# Patient Record
Sex: Female | Born: 2000 | Race: Black or African American | Hispanic: No | Marital: Single | Smoking: Never smoker
Health system: Southern US, Community
[De-identification: ages and names within clinical notes are randomized; demographics above are authoritative.]

## PROBLEM LIST (undated history)

## (undated) DIAGNOSIS — J45909 Unspecified asthma, uncomplicated: Secondary | ICD-10-CM

## (undated) DIAGNOSIS — T7840XA Allergy, unspecified, initial encounter: Secondary | ICD-10-CM

## (undated) HISTORY — DX: Allergy, unspecified, initial encounter: T78.40XA

## (undated) HISTORY — DX: Unspecified asthma, uncomplicated: J45.909

---
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
## 2018-04-03 HISTORY — PX: INTRAUTERINE DEVICE INSERTION: SHX323

## 2018-08-02 ENCOUNTER — Ambulatory Visit: Payer: Self-pay | Admitting: Podiatry

## 2018-08-08 ENCOUNTER — Other Ambulatory Visit: Payer: Self-pay

## 2018-08-08 ENCOUNTER — Ambulatory Visit: Payer: Self-pay | Admitting: Podiatry

## 2018-08-08 ENCOUNTER — Encounter: Payer: Self-pay | Admitting: Podiatry

## 2018-08-08 VITALS — Temp 98.1°F

## 2018-08-08 DIAGNOSIS — B351 Tinea unguium: Secondary | ICD-10-CM | POA: Diagnosis not present

## 2018-08-08 MED ORDER — TERBINAFINE HCL 250 MG PO TABS
250.0000 mg | ORAL_TABLET | Freq: Every day | ORAL | 0 refills | Status: DC
Start: 2018-08-08 — End: 2019-03-12

## 2018-08-08 NOTE — Progress Notes (Signed)
Subjective:   Patient ID: Doris Garcia, female   DOB: 18 y.o.   MRN: [id number]   HPI Presents stating she has had trouble with these nails for years and the fourth and fifth nails on both feet are becoming increasingly a problem for her.  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  She does not smoke and is very active   Review of Systems  All other systems reviewed and are negative.       Objective:  Physical Exam Vitals signs and nursing note reviewed.  Constitutional:      Appearance: She is well-developed.  Pulmonary:     Effort: Pulmonary effort is normal.  Musculoskeletal: Normal range of motion.  Skin:    General: Skin is warm.  Neurological:     Mental Status: She is alert.     Neurovascular status intact muscle strength is adequate range of motion within normal limits with patient noted to have dystrophic thickened nailbeds for 5 bilateral hallux right with discoloration and history of trauma and wearing soccer shoes that at times were too small for her feet.  Good digital perfusion noted     Assessment:  Probability that the problem she is experiencing is due to trauma with possibility also for fungal infection with family history     Plan:  H&P conditions reviewed.  We will get a try oral medication and I did explain there is no guarantee this will solve the problem and that ultimately nail removal may be necessary.  She wants to undergo this treatment and I did send her for liver function study and I placed her on Lamisil 250 mg daily for 90 days going over risk factors associated with it

## 2018-08-09 LAB — HEPATIC FUNCTION PANEL
AG Ratio: 1.7 (calc) (ref 1.0–2.5)
ALT: 7 U/L (ref 5–32)
AST: 14 U/L (ref 12–32)
Albumin: 4.7 g/dL (ref 3.6–5.1)
Alkaline phosphatase (APISO): 58 U/L (ref 36–128)
Bilirubin, Direct: 0.1 mg/dL (ref 0.0–0.2)
Globulin: 2.8 g/dL (calc) (ref 2.0–3.8)
Indirect Bilirubin: 0.3 mg/dL (calc) (ref 0.2–1.1)
Total Bilirubin: 0.4 mg/dL (ref 0.2–1.1)
Total Protein: 7.5 g/dL (ref 6.3–8.2)

## 2019-01-11 ENCOUNTER — Encounter (HOSPITAL_COMMUNITY): Payer: Self-pay | Admitting: *Deleted

## 2019-01-11 ENCOUNTER — Emergency Department (HOSPITAL_COMMUNITY)
Admission: EM | Admit: 2019-01-11 | Discharge: 2019-01-12 | Disposition: A | Discharge status: Home / Self Care | Payer: BC Managed Care – PPO | Attending: Emergency Medicine | Admitting: Emergency Medicine

## 2019-01-11 ENCOUNTER — Other Ambulatory Visit: Payer: Self-pay

## 2019-01-11 DIAGNOSIS — N83201 Unspecified ovarian cyst, right side: Secondary | ICD-10-CM | POA: Diagnosis not present

## 2019-01-11 DIAGNOSIS — N760 Acute vaginitis: Secondary | ICD-10-CM | POA: Diagnosis not present

## 2019-01-11 DIAGNOSIS — Z79899 Other long term (current) drug therapy: Secondary | ICD-10-CM | POA: Diagnosis not present

## 2019-01-11 DIAGNOSIS — Z113 Encounter for screening for infections with a predominantly sexual mode of transmission: Secondary | ICD-10-CM | POA: Diagnosis not present

## 2019-01-11 DIAGNOSIS — R1031 Right lower quadrant pain: Secondary | ICD-10-CM

## 2019-01-11 DIAGNOSIS — N83209 Unspecified ovarian cyst, unspecified side: Secondary | ICD-10-CM | POA: Diagnosis present

## 2019-01-11 DIAGNOSIS — R52 Pain, unspecified: Secondary | ICD-10-CM

## 2019-01-11 DIAGNOSIS — J45909 Unspecified asthma, uncomplicated: Secondary | ICD-10-CM | POA: Insufficient documentation

## 2019-01-11 DIAGNOSIS — B9689 Other specified bacterial agents as the cause of diseases classified elsewhere: Secondary | ICD-10-CM

## 2019-01-11 LAB — URINALYSIS, ROUTINE W REFLEX MICROSCOPIC
Bacteria, UA: NONE SEEN
Bilirubin Urine: NEGATIVE
Glucose, UA: NEGATIVE mg/dL
Ketones, ur: NEGATIVE mg/dL
Leukocytes,Ua: NEGATIVE
Nitrite: NEGATIVE
Protein, ur: NEGATIVE mg/dL
Specific Gravity, Urine: 1.006 (ref 1.005–1.030)
pH: 8 (ref 5.0–8.0)

## 2019-01-11 LAB — POC URINE PREG, ED: Preg Test, Ur: NEGATIVE

## 2019-01-11 MED ORDER — OXYCODONE-ACETAMINOPHEN 5-325 MG PO TABS
1.0000 | ORAL_TABLET | Freq: Once | ORAL | Status: AC
  Administered 2019-01-11: 1 via ORAL
  Filled 2019-01-11: qty 1

## 2019-01-11 NOTE — ED Provider Notes (Signed)
[facility] DEPT Provider Note   CSN: [id number] Arrival date & time: 01/11/19  2042     History   Chief Complaint Chief Complaint  Patient presents with  . Ovarian Cyst    HPI Doris Garcia is a 18 y.o. female with no significant medical history presents for RLQ and pelvic pain.   Patient states she has had intermittent gradually worsening right lower quadrant and pelvic pain that began 2 weeks ago was seen at [facility] office where she was diagnosed with ovarian cyst by ultrasound.  Patient states she was discharged with 600 mg ibuprofen but states that pain has since worsened to become constant, cramping, sharp and radiating into her back on the right side.  Patient states 2 hours prior to presentation to ED she had discharge which is unusual for her as she has an IUD in place. Patient denies any abdominal surgeries; states LBM was 2-3 days ago but states she has been eating less and usually has BM every other day at baseline.   Patient endorses some nausea intermittently none currently.  States she vomited once during soccer practice last week.  States pain is worse with movement but states she is unable to get comfortable now the pain is constant.  Patient denies fevers, chills, dysuria, frequency urgency, lightheadedness, dizziness or headache. Patient denies change in bowel movements.      HPI  Past Medical History:  Diagnosis Date  . Allergy   . Asthma     Patient Active Problem List   Diagnosis Date Noted  . Pronation of both feet 04/12/2016    History reviewed. No pertinent surgical history.   OB History   No obstetric history on file.      Home Medications    Prior to Admission medications   Medication Sig Start Date End Date Taking? Authorizing Provider  Budesonide (PULMICORT IN) Inhale into the lungs.    [provider]  ibuprofen (ADVIL,MOTRIN) 200 MG tablet Take 400 mg by mouth every 6 (six) hours as needed for  moderate pain.    [provider]  metoCLOPramide (REGLAN) 10 MG tablet Take 1 tablet (10 mg total) by mouth 4 (four) times daily. Patient not taking: Reported on 08/08/2018 12/30/15   Delos Haring, PA-C  mometasone (NASONEX) 50 MCG/ACT nasal spray Place 2 sprays into the nose daily.    [provider]  ondansetron (ZOFRAN ODT) 4 MG disintegrating tablet Take 1 tablet (4 mg total) by mouth every 8 (eight) hours as needed for nausea or vomiting. Patient not taking: Reported on 08/08/2018 12/30/15   Delos Haring, PA-C  Tavaborole [facility]) 5 % SOLN Apply one drop to each affected toenail once daily for 12 months Patient not taking: Reported on 12/29/2015 09/19/13   Harriet Masson, DPM  terbinafine (LAMISIL) 250 MG tablet Take 1 tablet (250 mg total) by mouth daily. 08/08/18   Wallene Huh, DPM    Family History No family history on file.  Social History Social History   Tobacco Use  . Smoking status: Never Smoker  . Smokeless tobacco: Never Used  Substance Use Topics  . Alcohol use: No  . Drug use: Not on file     Allergies   Penicillins   Review of Systems Review of Systems   Physical Exam Updated Vital Signs BP 131/77 (BP Location: Left Arm)   Pulse 60   Temp 99.3 F (37.4 C) (Oral)   Resp 16   SpO2 100%   Physical  Exam Vitals signs and nursing note reviewed. Exam conducted with a chaperone present.  Constitutional:      Appearance: She is not ill-appearing.  HENT:     Head: Normocephalic and atraumatic.     Mouth/Throat:     Mouth: Mucous membranes are moist.  Eyes:     General: No scleral icterus. Neck:     Musculoskeletal: No neck rigidity.  Cardiovascular:     Rate and Rhythm: Normal rate and regular rhythm.     Pulses: Normal pulses.     Heart sounds: Normal heart sounds.  Pulmonary:     Effort: Pulmonary effort is normal.     Breath sounds: Normal breath sounds.  Abdominal:     General: Abdomen is flat. Bowel sounds are normal.  There is no distension.     Palpations: Abdomen is soft.     Tenderness: There is abdominal tenderness (RLQ and pelvic). There is guarding. There is no right CVA tenderness, left CVA tenderness or rebound.  Genitourinary:    Comments: Vulva without lesions or abnormality Vaginal canal with blood pooling in vaginal canal Cervix appears normal, is closed, IUD strings  Right sided adnexal tenderness no CMT Musculoskeletal:     Right lower leg: No edema.     Left lower leg: No edema.  Skin:    General: Skin is warm and dry.     Capillary Refill: Capillary refill takes less than 2 seconds.  Neurological:     Mental Status: She is alert. Mental status is at baseline.  Psychiatric:        Behavior: Behavior normal.      ED Treatments / Results  Labs (all labs ordered are listed, but only abnormal results are displayed) Labs Reviewed  URINALYSIS, ROUTINE W REFLEX MICROSCOPIC - Abnormal; Notable for the following components:      Result Value   Color, Urine STRAW (*)    Hgb urine dipstick MODERATE (*)    All other components within normal limits  WET PREP, GENITAL  POC URINE PREG, ED  GC/CHLAMYDIA PROBE AMP ([place]) NOT [facility]    EKG None  Radiology No results found.  Procedures Procedures (including critical care time)  Medications Ordered in ED Medications  oxyCODONE-acetaminophen (PERCOCET/ROXICET) 5-325 MG per tablet 1 tablet (1 tablet Oral Given 01/11/19 2326)     Initial Impression / Assessment and Plan / ED Course  I have reviewed the triage vital signs and the nursing notes.  Pertinent labs & imaging results that were available during my care of the patient were reviewed by me and considered in my medical decision making (see chart for details).        Patient is 18 year old female with [facility] with worsening pain over the past 2 days.   Concern for torsion as patient is in severe pain which has worsened and  become constant now.  Also concern for cyst rupture. Pelvic exam with right-sided adnexal tenderness without CMT.   UA shows moderate Hgb likely due to bloody vaginal discharge.   Patient given percocet for pain control. Declined zofran for nausea.   Doubt pyelonephritis or urinary stone as patient has had no urinary symptoms, dysuria, has no CVA tenderness, and states pain is worse in RLQ.     12:19 AM Handoff to [place].  Wet prep and TV ultrasound pending for torsion rule out. If US without torsion -- repeat abdominal exam. Patient will likely be able to follow up  with OBGYN outpatient. If abdominal exam is worsened patient may require further workup at provider discretion.    Final Clinical Impressions(s) / ED Diagnoses   Final diagnoses:  RLQ abdominal pain    ED Discharge Orders    None       Gailen Shelter, [place] 01/12/19 0119    Maia Plan, MD 01/12/19 1154

## 2019-01-11 NOTE — ED Triage Notes (Signed)
Pt reporting right lower abdominal pain. Stating she was seen at her OBGYN on Wednesday, u/s completed and she was told she has an ovarian cyst. Taking ibuprofen, but the pain is worse. IUD in place.

## 2019-01-12 ENCOUNTER — Emergency Department (HOSPITAL_COMMUNITY): Payer: BC Managed Care – PPO

## 2019-01-12 LAB — WET PREP, GENITAL
Sperm: NONE SEEN
Trich, Wet Prep: NONE SEEN
Yeast Wet Prep HPF POC: NONE SEEN

## 2019-01-12 MED ORDER — METRONIDAZOLE 500 MG PO TABS: 500.0000 mg | ORAL_TABLET | Freq: Two times a day (BID) | ORAL | 0 refills | Status: DC

## 2019-01-12 NOTE — ED Provider Notes (Signed)
Patient signed out to me at shift change.  Patient pending ultrasound pelvis.  Abdomen reassessed, discomfort seems to be mainly located in the pelvis, and not in the abdomen.  Doubt appendicitis.  Ultrasound reveals 4 mm right-sided ovarian cyst.  No evidence of abscess or torsion.  Clue cells found on wet prep.  Will treat for BV.  Advised ibuprofen for ovarian cyst and follow-up with OB/GYN.  Return precautions discussed.   Montine Circle, PA-C 01/12/19 0159    Fatima Blank, MD 01/15/19 2112

## 2019-01-14 LAB — GC/CHLAMYDIA PROBE AMP (~~LOC~~) NOT AT ARMC
Chlamydia: NEGATIVE
Neisseria Gonorrhea: NEGATIVE

## 2019-03-12 ENCOUNTER — Encounter: Payer: Self-pay | Admitting: Gastroenterology

## 2019-03-12 ENCOUNTER — Other Ambulatory Visit (INDEPENDENT_AMBULATORY_CARE_PROVIDER_SITE_OTHER): Payer: BC Managed Care – PPO

## 2019-03-12 ENCOUNTER — Other Ambulatory Visit: Payer: Self-pay

## 2019-03-12 ENCOUNTER — Ambulatory Visit (INDEPENDENT_AMBULATORY_CARE_PROVIDER_SITE_OTHER): Payer: BC Managed Care – PPO | Admitting: Gastroenterology

## 2019-03-12 VITALS — BP 110/80 | HR 83 | Temp 98.3°F | Ht 66.0 in | Wt 146.0 lb

## 2019-03-12 DIAGNOSIS — K219 Gastro-esophageal reflux disease without esophagitis: Secondary | ICD-10-CM

## 2019-03-12 DIAGNOSIS — K625 Hemorrhage of anus and rectum: Secondary | ICD-10-CM

## 2019-03-12 DIAGNOSIS — R1030 Lower abdominal pain, unspecified: Secondary | ICD-10-CM

## 2019-03-12 DIAGNOSIS — K5900 Constipation, unspecified: Secondary | ICD-10-CM

## 2019-03-12 DIAGNOSIS — Z1159 Encounter for screening for other viral diseases: Secondary | ICD-10-CM

## 2019-03-12 LAB — CBC WITH DIFFERENTIAL/PLATELET
Basophils Absolute: 0.1 10*3/uL (ref 0.0–0.1)
Basophils Relative: 1 % (ref 0.0–3.0)
Eosinophils Absolute: 0.2 10*3/uL (ref 0.0–0.7)
Eosinophils Relative: 3.1 % (ref 0.0–5.0)
HCT: 44.8 % (ref 36.0–46.0)
Hemoglobin: 14.9 g/dL (ref 12.0–15.0)
Lymphocytes Relative: 31.9 % (ref 12.0–46.0)
Lymphs Abs: 1.7 10*3/uL (ref 0.7–4.0)
MCHC: 33.3 g/dL (ref 30.0–36.0)
MCV: 88.4 fl (ref 78.0–100.0)
Monocytes Absolute: 0.5 10*3/uL (ref 0.1–1.0)
Monocytes Relative: 8.8 % (ref 3.0–12.0)
Neutro Abs: 2.9 10*3/uL (ref 1.4–7.7)
Neutrophils Relative %: 55.2 % (ref 43.0–77.0)
Platelets: 230 10*3/uL (ref 150.0–400.0)
RBC: 5.07 Mil/uL (ref 3.87–5.11)
RDW: 13.5 % (ref 11.5–14.6)
WBC: 5.3 10*3/uL (ref 4.5–10.5)

## 2019-03-12 LAB — COMPREHENSIVE METABOLIC PANEL
ALT: 8 U/L (ref 0–35)
AST: 15 U/L (ref 0–37)
Albumin: 4.5 g/dL (ref 3.5–5.2)
Alkaline Phosphatase: 54 U/L (ref 39–117)
BUN: 15 mg/dL (ref 6–23)
CO2: 26 mEq/L (ref 19–32)
Calcium: 9.5 mg/dL (ref 8.4–10.5)
Chloride: 104 mEq/L (ref 96–112)
Creatinine, Ser: 0.8 mg/dL (ref 0.40–1.20)
GFR: 110.62 mL/min (ref >60.00)
Glucose, Bld: 89 mg/dL (ref 70–99)
Potassium: 3.9 mEq/L (ref 3.5–5.1)
Sodium: 136 mEq/L (ref 135–145)
Total Bilirubin: 0.6 mg/dL (ref 0.2–1.2)
Total Protein: 7.4 g/dL (ref 6.0–8.3)

## 2019-03-12 LAB — H. PYLORI ANTIBODY, IGG: H Pylori IgG: NEGATIVE

## 2019-03-12 MED ORDER — OMEPRAZOLE 20 MG PO CPDR: 20.0000 mg | DELAYED_RELEASE_CAPSULE | Freq: Every day | ORAL | 1 refills | Status: AC

## 2019-03-12 MED ORDER — POLYETHYLENE GLYCOL 3350 17 G PO PACK: PACK | ORAL | 0 refills | Status: DC

## 2019-03-12 MED ORDER — SUPREP BOWEL PREP KIT 17.5-3.13-1.6 GM/177ML PO SOLN: ORAL | 0 refills | Status: DC

## 2019-03-12 NOTE — Progress Notes (Signed)
HPI :  18 year old female with a history of asthma and allergies, otherwise healthy, referred here by Zelphia CairoGretchen Adkins MD for abdominal pain.  She states she has had longstanding lower abdominal pain ongoing for a few years.  She will feel this in her bilateral lower abdomen to mid abdomen.  Over the years it comes and goes, however she feels it to some extent most days of the week.  She generally feels this get worse after she eats something, usually within a few hours she will feel it.  She has not been taking any NSAIDs, uses Tylenol only.  She has some nausea with it but no vomiting.  She does endorse some reflux which bothers her periodically, no dysphagia or odynophagia.  She does have occasional upper abdominal pain as well however is different from her lower pain and is not related to eating.  She has decreased stool frequency at baseline, has approximately 1 bowel movement a week.  She has been noticing some red blood in her stools which bothers her intermittently.  Her pain can sometimes be worsened with a bowel movement, not improved.  She has lost about 7 to 8 pounds over the past 2 months because her appetite is poor.  She is fearful of worsening pain with eating.  She denies any family history of colon cancer, no family history of IBD.  Her grandmother had breast cancer.  She does not smoke cigarettes or use alcohol.  She is a Consulting civil engineerstudent in college.  Thus far she has had a pelvic ultrasound by her gynecologist in October which showed a 4 cm simple cyst in her right ovary with an IUD in place.  She had a follow-up ultrasound in November which showed no ovarian cyst.  They do not think her ovaries are the cause of her symptoms and was referred to our practice.  She had a prior CT scan in 2017 which was normal, no bowel wall thickening.  She has not had any recent lab work.  US pelvis - 01/12/19 - 4cm simple cyst R ovary, IUD in place  US pelvis 02/21/19 - no adnexal mass noted  CT abdomen /  pelvis with contrast - 12/30/2015 - normal  Past Medical History:  Diagnosis Date  . Allergy   . Asthma      History reviewed. No pertinent surgical history. Family History  Problem Relation Age of Onset  . Breast cancer Mother   . Hypertension Father   . Breast cancer Paternal Grandmother    Social History   Tobacco Use  . Smoking status: Never Smoker  . Smokeless tobacco: Never Used  Substance Use Topics  . Alcohol use: No  . Drug use: Never   Current Outpatient Medications  Medication Sig Dispense Refill  . Budesonide (PULMICORT IN) Inhale into the lungs.    . fluticasone (FLONASE) 50 MCG/ACT nasal spray Place 1 spray into both nostrils daily.    Marland Kitchen. ibuprofen (ADVIL,MOTRIN) 200 MG tablet Take 400 mg by mouth every 6 (six) hours as needed for moderate pain.    Marland Kitchen. levonorgestrel (MIRENA, 52 MG,) 20 MCG/24HR IUD 1 Device by Intrauterine route See admin instructions. Take 1 device by intrauterine route    . mometasone (NASONEX) 50 MCG/ACT nasal spray Place 2 sprays into the nose daily.    . ondansetron (ZOFRAN ODT) 4 MG disintegrating tablet Take 1 tablet (4 mg total) by mouth every 8 (eight) hours as needed for nausea or vomiting. 20 tablet 0   No  current facility-administered medications for this visit.    Allergies  Allergen Reactions  . Penicillins Hives    Has patient had a PCN reaction causing immediate rash, facial/tongue/throat swelling, SOB or lightheadedness with hypotension: Yes Has patient had a PCN reaction causing severe rash involving mucus membranes or skin necrosis: No Has patient had a PCN reaction that required hospitalization No Has patient had a PCN reaction occurring within the last 10 years: No If all of the above answers are "NO", then may proceed with Cephalosporin use.      Review of Systems: All systems reviewed and negative except where noted in HPI.   Lab Results  Component Value Date   WBC 9.6 12/29/2015   HGB 15.2 12/29/2015   HCT 44.3  12/29/2015   MCV 84.2 12/29/2015   PLT 271 12/29/2015    Lab Results  Component Value Date   CREATININE 0.97 12/29/2015   BUN 13 12/29/2015   NA 140 12/29/2015   K 3.6 12/29/2015   CL 105 12/29/2015   CO2 24 12/29/2015    Lab Results  Component Value Date   ALT 7 08/08/2018   AST 14 08/08/2018   ALKPHOS 83 12/29/2015   BILITOT 0.4 08/08/2018     Physical Exam: BP 110/80   Pulse 83   Temp 98.3 F (36.8 C)   Ht 5\' 6"  (1.676 m)   Wt 146 lb (66.2 kg)   BMI 23.57 kg/m  Constitutional: Pleasant,well-developed, female in no acute distress. HEENT: Normocephalic and atraumatic. Conjunctivae are normal. No scleral icterus. Neck supple.  Cardiovascular: Normal rate, regular rhythm.  Pulmonary/chest: Effort normal and breath sounds normal. No wheezing, rales or rhonchi. Abdominal: Soft, some lower to mid abdominal tenderness bilateral lower quadrants, nontender.  There are no masses palpable.  Extremities: no edema Lymphadenopathy: No cervical adenopathy noted. Neurological: Alert and oriented to person place and time. Skin: Skin is warm and dry. No rashes noted. Psychiatric: Normal mood and affect. Behavior is normal.   ASSESSMENT AND PLAN: 18 year old female here for new patient consultation regarding the following:  Abdominal pain / rectal bleeding / constipation / GERD - she has had some longstanding symptoms of lower abdominal pain periodically, has had progressive worsening over the past few months associated with rectal bleeding and some weight loss.  Imaging of her pelvis initially showed an ovarian cyst which has since resolved but her symptoms persist.  She has baseline constipation but actually feels some worsening with a bowel movement and not improvement.  I discussed differential diagnosis with her.  I will check some basic labs today to make sure stable, ensure no anemia, etc.  Ultimately given her worsening pain, rectal bleeding and weight loss, I am recommending  a colonoscopy to further evaluate this issue.  I discussed what colonoscopy is, risk / benefits, how to prepare for it, etc. she verbalized understanding and wanted to proceed with this.  As we get her ready for the bowel preparation, given her baseline constipation we will give her MiraLAX to take a few times a day to make sure she is moving her bowels more regularly, will see if this makes her feel any better at all.  Regarding her upper abdominal pain and some of her reflux symptoms, will place her on omeprazole 20 mg once a day to see if that helps over the next 4 weeks.  I will also check an H. pylori IgG serology and treat if positive.  Will await her course on the regimen, further  recommendations pending her course and results of her colonoscopy.  She agreed with the plan  Ileene Patrick, MD [facility]  CC: Zelphia Cairo, MD

## 2019-03-12 NOTE — Patient Instructions (Signed)
If you are age 20 or older, your body mass index should be between 23-30. Your Body mass index is 23.57 kg/m. If this is out of the aforementioned range listed, please consider follow up with your Primary Care Provider.  If you are age 97 or younger, your body mass index should be between 19-25. Your Body mass index is 23.57 kg/m. If this is out of the aformentioned range listed, please consider follow up with your Primary Care Provider.   You have been scheduled for a colonoscopy. Please follow written instructions given to you at your visit today.  Please pick up your prep supplies at the pharmacy within the next 1-3 days. If you use inhalers (even only as needed), please bring them with you on the day of your procedure. Your physician has requested that you go to www.startemmi.com and enter the access code given to you at your visit today. This web site gives a general overview about your procedure. However, you should still follow specific instructions given to you by our office regarding your preparation for the procedure.  Please go to the lab in the basement of our building to have lab work done as you leave today. Hit "B" for basement when you get on the elevator.  When the doors open the lab is on your left.  We will call you with the results. Thank you.  We have sent the following medications to your pharmacy for you to pick up at your convenience: Omeprazole 20mg : Take once daily  Please purchase the following medications over the counter and take as directed: Miralax, take as directed once to twice daily  Thank you for entrusting me with your care and for choosing [facility], Dr. Barry Cellar

## 2019-03-14 ENCOUNTER — Ambulatory Visit (INDEPENDENT_AMBULATORY_CARE_PROVIDER_SITE_OTHER): Payer: BC Managed Care – PPO

## 2019-03-14 ENCOUNTER — Encounter: Payer: Self-pay | Admitting: Gastroenterology

## 2019-03-14 DIAGNOSIS — Z1159 Encounter for screening for other viral diseases: Secondary | ICD-10-CM

## 2019-03-17 LAB — SARS CORONAVIRUS 2 (TAT 6-24 HRS): SARS Coronavirus 2: NEGATIVE

## 2019-03-18 ENCOUNTER — Encounter: Payer: Self-pay | Admitting: Gastroenterology

## 2019-03-18 ENCOUNTER — Ambulatory Visit (AMBULATORY_SURGERY_CENTER): Payer: BC Managed Care – PPO | Admitting: Gastroenterology

## 2019-03-18 ENCOUNTER — Other Ambulatory Visit: Payer: Self-pay

## 2019-03-18 VITALS — BP 117/68 | HR 72 | Temp 98.9°F | Resp 15 | Ht 66.0 in | Wt 146.0 lb

## 2019-03-18 DIAGNOSIS — K648 Other hemorrhoids: Secondary | ICD-10-CM

## 2019-03-18 DIAGNOSIS — K6289 Other specified diseases of anus and rectum: Secondary | ICD-10-CM

## 2019-03-18 DIAGNOSIS — K625 Hemorrhage of anus and rectum: Secondary | ICD-10-CM | POA: Diagnosis not present

## 2019-03-18 DIAGNOSIS — K5900 Constipation, unspecified: Secondary | ICD-10-CM | POA: Diagnosis not present

## 2019-03-18 DIAGNOSIS — R1030 Lower abdominal pain, unspecified: Secondary | ICD-10-CM

## 2019-03-18 HISTORY — PX: COLONOSCOPY: SHX174

## 2019-03-18 MED ORDER — SODIUM CHLORIDE 0.9 % IV SOLN: 500.0000 mL | Freq: Once | INTRAVENOUS | Status: DC

## 2019-03-18 NOTE — Progress Notes (Signed)
Called to room to assist during endoscopic procedure.  Patient ID and intended procedure confirmed with present staff. Received instructions for my participation in the procedure from the performing physician.  

## 2019-03-18 NOTE — Patient Instructions (Addendum)
YOU HAD AN ENDOSCOPIC PROCEDURE TODAY AT [facility]:   Refer to the procedure report that was given to you for any specific questions about what was found during the examination.  If the procedure report does not answer your questions, please call your gastroenterologist to clarify.  If you requested that your care partner not be given the details of your procedure findings, then the procedure report has been included in a sealed envelope for you to review at your convenience later.  YOU SHOULD EXPECT: Some feelings of bloating in the abdomen. Passage of more gas than usual.  Walking can help get rid of the air that was put into your GI tract during the procedure and reduce the bloating. If you had a lower endoscopy (such as a colonoscopy or flexible sigmoidoscopy) you may notice spotting of blood in your stool or on the toilet paper. If you underwent a bowel prep for your procedure, you may not have a normal bowel movement for a few days.  Please Note:  You might notice some irritation and congestion in your nose or some drainage.  This is from the oxygen used during your procedure.  There is no need for concern and it should clear up in a day or so.  SYMPTOMS TO REPORT IMMEDIATELY:   Following lower endoscopy (colonoscopy or flexible sigmoidoscopy):  Excessive amounts of blood in the stool  Significant tenderness or worsening of abdominal pains  Swelling of the abdomen that is new, acute  Fever of 100F or higher  For urgent or emergent issues, a gastroenterologist can be reached at any hour by calling [phone].   DIET:  We do recommend a small meal at first, but then you may proceed to your regular diet.  Drink plenty of fluids but you should avoid alcoholic beverages for 24 hours.  ACTIVITY:  You should plan to take it easy for the rest of today and you should NOT DRIVE or use heavy machinery until tomorrow (because of the sedation medicines used during the test).     FOLLOW UP: Our staff will call the number listed on your records 48-72 hours following your procedure to check on you and address any questions or concerns that you may have regarding the information given to you following your procedure. If we do not reach you, we will leave a message.  We will attempt to reach you two times.  During this call, we will ask if you have developed any symptoms of COVID 19. If you develop any symptoms (ie: fever, flu-like symptoms, shortness of breath, cough etc.) before then, please call [phone].  If you test positive for Covid 19 in the 2 weeks post procedure, please call and report this information to [place].    If any biopsies were taken you will be contacted by phone or by letter within the next 1-3 weeks.  Please call us at [phone] if you have not heard about the biopsies in 3 weeks.    SIGNATURES/CONFIDENTIALITY: You and/or your care partner have signed paperwork which will be entered into your electronic medical record.  These signatures attest to the fact that that the information above on your After Visit Summary has been reviewed and is understood.  Full responsibility of the confidentiality of this discharge information lies with you and/or your care-partner.    Handouts were given to you on  Hemorrhoids. You may resume your current medications today. Continue MIRALAX for constipation, titrate up dosing as needed to  keep stools soft, avoid straining. Await biopsy results. Please call if any questions or concerns.

## 2019-03-18 NOTE — Op Note (Signed)
[facility] Patient Name: Doris Garcia Procedure Date: 03/18/2019 1:58 PM MRN: [id number] Endoscopist: Viviann Spare P. Adela Lank , MD Age: 18 Referring MD:  Date of Birth: 01/16/2001 Gender: Female Account #: [id number] Procedure:                Colonoscopy Indications:              Hematochezia, Lower abdominal pain, history of                            constipation Medicines:                Monitored Anesthesia Care Procedure:                Pre-Anesthesia Assessment:                           - Prior to the procedure, a History and Physical                            was performed, and patient medications and                            allergies were reviewed. The patient's tolerance of                            previous anesthesia was also reviewed. The risks                            and benefits of the procedure and the sedation                            options and risks were discussed with the patient.                            All questions were answered, and informed consent                            was obtained. Prior Anticoagulants: The patient has                            taken no previous anticoagulant or antiplatelet                            agents. ASA Grade Assessment: II - A patient with                            mild systemic disease. After reviewing the risks                            and benefits, the patient was deemed in                            satisfactory condition to undergo the procedure.  After obtaining informed consent, the colonoscope                            was passed under direct vision. Throughout the                            procedure, the patient's blood pressure, pulse, and                            oxygen saturations were monitored continuously. The                            Colonoscope was introduced through the anus and                            advanced to the the terminal ileum, with                           identification of the appendiceal orifice and IC                            valve. The colonoscopy was performed without                            difficulty. The patient tolerated the procedure                            well. The quality of the bowel preparation was                            good. The terminal ileum, ileocecal valve,                            appendiceal orifice, and rectum were photographed. Scope In: 2:05:31 PM Scope Out: 2:28:35 PM Scope Withdrawal Time: 0 hours 18 minutes 27 seconds  Total Procedure Duration: 0 hours 23 minutes 4 seconds  Findings:                 The perianal and digital rectal examinations were                            normal.                           The terminal ileum appeared normal.                           Internal hemorrhoids were found during                            retroflexion. The hemorrhoids were small.                           Anal papilla(e) were hypertrophied with ?  hyperplastic changes. A small biopsy was taken with                            a cold forceps for histologym rule out AIN.                           The exam was otherwise without abnormality. Complications:            No immediate complications. Estimated blood loss:                            Minimal. Estimated Blood Loss:     Estimated blood loss was minimal. Impression:               - The examined portion of the ileum was normal.                           - Internal hemorrhoids.                           - Anal papilla(e) were hypertrophied. Biopsied.                           - The examination was otherwise normal.                           Suspect bleeding could be coming from small                            hemorrhoids in the setting of constipation. Recommendation:           - Patient has a contact number available for                            emergencies. The signs and symptoms of potential                             delayed complications were discussed with the                            patient. Return to normal activities tomorrow.                            Written discharge instructions were provided to the                            patient.                           - Resume previous diet.                           - Continue present medications.                           - Continue Miralax for constipation, titrate up  dosing as needed to keep stools soft, avoid                            straining                           - Await pathology results. Viviann Spare P. Maurianna Benard, MD 03/18/2019 2:33:13 PM This report has been signed electronically.

## 2019-03-18 NOTE — Progress Notes (Signed)
No problems noted in the recovery room. maw 

## 2019-03-18 NOTE — Progress Notes (Signed)
Pt's states no medical or surgical changes since previsit or office visit.  Temp LC Vitals CW 

## 2019-03-20 ENCOUNTER — Telehealth: Payer: Self-pay | Admitting: *Deleted

## 2019-03-20 ENCOUNTER — Telehealth: Payer: Self-pay

## 2019-03-20 NOTE — Telephone Encounter (Signed)
LVM

## 2019-03-20 NOTE — Telephone Encounter (Signed)
First follow up call attempt. Left vm. 

## 2019-03-21 ENCOUNTER — Encounter: Payer: Self-pay | Admitting: Gastroenterology

## 2019-03-31 ENCOUNTER — Ambulatory Visit: Payer: BC Managed Care – PPO | Admitting: Allergy and Immunology

## 2019-04-21 ENCOUNTER — Encounter: Payer: Self-pay | Admitting: Allergy and Immunology

## 2019-04-21 ENCOUNTER — Other Ambulatory Visit: Payer: Self-pay

## 2019-04-21 ENCOUNTER — Ambulatory Visit: Payer: BC Managed Care – PPO | Admitting: Allergy and Immunology

## 2019-04-21 ENCOUNTER — Telehealth: Payer: Self-pay | Admitting: *Deleted

## 2019-04-21 VITALS — BP 92/60 | HR 62 | Temp 98.1°F | Resp 18 | Ht 66.0 in | Wt 144.8 lb

## 2019-04-21 DIAGNOSIS — J4540 Moderate persistent asthma, uncomplicated: Secondary | ICD-10-CM | POA: Diagnosis not present

## 2019-04-21 DIAGNOSIS — J310 Chronic rhinitis: Secondary | ICD-10-CM

## 2019-04-21 DIAGNOSIS — K9049 Malabsorption due to intolerance, not elsewhere classified: Secondary | ICD-10-CM | POA: Diagnosis not present

## 2019-04-21 MED ORDER — ALBUTEROL SULFATE HFA 108 (90 BASE) MCG/ACT IN AERS: 1.0000 | INHALATION_SPRAY | Freq: Four times a day (QID) | RESPIRATORY_TRACT | 1 refills | Status: AC | PRN

## 2019-04-21 MED ORDER — AZELASTINE HCL 0.1 % NA SOLN: 2.0000 | Freq: Two times a day (BID) | NASAL | 5 refills | Status: AC

## 2019-04-21 MED ORDER — BUDESONIDE-FORMOTEROL FUMARATE 160-4.5 MCG/ACT IN AERO: 2.0000 | INHALATION_SPRAY | Freq: Two times a day (BID) | RESPIRATORY_TRACT | 5 refills | Status: DC

## 2019-04-21 NOTE — Assessment & Plan Note (Addendum)
Currently with suboptimal control.  A prescription has been provided for Symbicort (budesonide/formoterol) 160/4.5 g, 2 inhalations twice a day. To maximize pulmonary deposition, a spacer has been provided along with instructions for its proper administration with an HFA inhaler.  Continue albuterol HFA, 1 to 2 inhalations every 4-6 hours if needed. I have also recommended using albuterol approximately 15 minutes prior to exercise.  Subjective and objective measures of pulmonary function will be followed and the treatment plan will be adjusted accordingly.

## 2019-04-21 NOTE — Telephone Encounter (Signed)
Pharmacy sent over fax stating that Symbicort 160 is not covered and that preferred alternatives are Symbicort Aer, Advair Diskus, Earlie Server, Wixela, Fluticsalmeaer. Please advise change in medication.

## 2019-04-21 NOTE — Progress Notes (Signed)
New Patient Note  RE: Doris Garcia MRN: [id number] DOB: 07-Nov-2000 Date of Office Visit: 04/21/2019  Referring provider: Hall Busing, MD Primary care provider: Aura Dials, PA-C  Chief Complaint: Asthma, Allergic Rhinitis , and Food Intolerance  History of present illness: Doris Garcia is a 19 y.o. female seen today in consultation requested by Gearldine Bienenstock, MD.  She complains of nasal congestion, rhinorrhea, sneezing, postnasal drainage, and sinus pressure over the forehead and between the eyes.  No significant seasonal symptom variation has been noted nor have specific environmental triggers been identified.  She is currently taking cetirizine and fluticasone nasal spray without adequate symptom relief. Doris Garcia has had asthma since childhood with symptoms consisting of wheezing, coughing, chest tightness, and dyspnea.  These symptoms are triggered by exercise, upper respiratory tract infections, pollen exposure, and exposure to dogs.  She is awakened from sleep at night due to lower respiratory symptoms 3-4 nights per month on average.  Albuterol provides temporary relief.  She is currently not on an asthma controller medication. Doris Garcia reports that approximately 1 year ago she consumed shrimp dinner and then went to bed.  She woke up in the middle the night vomiting profusely.  She reports that she "vomited the whole night."  The symptoms resolved without medical intervention.  She is currently unable to even smell shrimp without feeling nauseated.  She also notes that she avoids beef and chicken because she believes that these foods cause abdominal discomfort and bloating.  She has not experience concomitant urticaria, angioedema, or cardiopulmonary symptoms.  Assessment and plan: Moderate persistent asthma Currently with suboptimal control.  A prescription has been provided for Symbicort (budesonide/formoterol) 160/4.5 g, 2 inhalations twice a day. To maximize pulmonary  deposition, a spacer has been provided along with instructions for its proper administration with an HFA inhaler.  Continue albuterol HFA, 1 to 2 inhalations every 4-6 hours if needed. I have also recommended using albuterol approximately 15 minutes prior to exercise.  Subjective and objective measures of pulmonary function will be followed and the treatment plan will be adjusted accordingly.  Chronic rhinitis All seasonal and perennial aeroallergen skin tests are negative despite a positive histamine control.  Intranasal steroids, intranasal antihistamines, and first generation antihistamines are effective for symptoms associated with non-allergic rhinitis, whereas second generation antihistamines such as cetirizine (Zyrtec), loratadine (Claritin) and fexofenadine (Allegra) have been found to be ineffective for this condition.  A prescription has been provided for azelastine nasal spray, one spray per nostril 1-2 times daily as needed. Proper nasal spray technique has been discussed and demonstrated.  Nasal saline lavage (NeilMed) has been recommended as needed and prior to medicated nasal sprays along with instructions for proper administration.  Intolerance, food Gastrointestinal symptoms, uncertain etiology. Skin tests to select food allergens were negative today. The negative predictive value of food allergen skin testing is excellent (approximately 95%). While this does not appear to be an IgE mediated issue, skin testing does not rule out food intolerances or cell-mediated enteropathies which may lend to GI symptoms. These etiologies are suggested when elimination of the responsible food leads to symptom resolution and re-introduction of the food is followed by the return of symptoms.   The patient has been encouraged to keep a careful symptom/food journal and eliminate any food suspected of correlating with symptoms. Should symptoms concerning for anaphylaxis arise, 911 is to be called  immediately.  If GI symptoms persist or progress, gastroenterologist evaluation may be warranted.   Meds ordered  this encounter  Medications  . budesonide-formoterol (SYMBICORT) 160-4.5 MCG/ACT inhaler    Sig: Inhale 2 puffs into the lungs 2 (two) times daily.    Dispense:  1 Inhaler    Refill:  5  . albuterol (PROAIR HFA) 108 (90 Base) MCG/ACT inhaler    Sig: Inhale 1-2 puffs into the lungs every 6 (six) hours as needed for wheezing or shortness of breath.    Dispense:  18 g    Refill:  1  . azelastine (ASTELIN) 0.1 % nasal spray    Sig: Place 2 sprays into both nostrils 2 (two) times daily.    Dispense:  30 mL    Refill:  5    Diagnostics: Spirometry: 4.14 L and FEV1 of 1.78 L (57% predicted) without postbronchodilator improvement.  The patient's technique was questionable.  Please see scanned spirometry results for details. Epicutaneous testing: Negative despite a positive histamine control. Intradermal testing: Negative. Food allergen skin testing: Negative despite a positive histamine control.    Physical examination: Blood pressure 92/60, pulse 62, temperature 98.1 F (36.7 C), temperature source Temporal, resp. rate 18, height 5\' 6"  (1.676 m), weight 144 lb 12.8 oz (65.7 kg), SpO2 97 %.  General: Alert, interactive, in no acute distress. HEENT: TMs pearly gray, turbinates moderately edematous with clear discharge, post-pharynx moderately erythematous. Neck: Supple without lymphadenopathy. Lungs: Clear to auscultation without wheezing, rhonchi or rales. CV: Normal S1, S2 without murmurs. Abdomen: Nondistended, nontender. Skin: Warm and dry, without lesions or rashes. Extremities:  No clubbing, cyanosis or edema. Neuro:   Grossly intact.  Review of systems:  Review of systems negative except as noted in HPI / PMHx or noted below: Review of Systems  Constitutional: Negative.   HENT: Negative.   Eyes: Negative.   Respiratory: Negative.   Cardiovascular: Negative.     Gastrointestinal: Negative.   Genitourinary: Negative.   Musculoskeletal: Negative.   Skin: Negative.   Neurological: Negative.  Negative for seizures.  Endo/Heme/Allergies: Negative.   Psychiatric/Behavioral: Negative.     Past medical history:  Past Medical History:  Diagnosis Date  . Allergy   . Asthma     Past surgical history:  Past Surgical History:  Procedure Laterality Date  . COLONOSCOPY  03/18/2019  . INTRAUTERINE DEVICE INSERTION  2020    Family history: Family History  Problem Relation Age of Onset  . Breast cancer Mother   . Hypertension Father   . Breast cancer Paternal Grandmother   . Colon cancer Neg Hx   . Colon polyps Neg Hx   . Esophageal cancer Neg Hx   . Rectal cancer Neg Hx   . Stomach cancer Neg Hx     Social history: Social History   Socioeconomic History  . Marital status: Single    Spouse name: Not on file  . Number of children: Not on file  . Years of education: Not on file  . Highest education level: Not on file  Occupational History  . Not on file  Tobacco Use  . Smoking status: Never Smoker  . Smokeless tobacco: Never Used  Substance and Sexual Activity  . Alcohol use: No  . Drug use: Never  . Sexual activity: Yes    Birth control/protection: I.U.D.  Other Topics Concern  . Not on file  Social History Narrative  . Not on file   Social Determinants of Health   Financial Resource Strain:   . Difficulty of Paying Living Expenses: Not on file  Food Insecurity:   .  Worried About Programme researcher, broadcasting/film/video in the Last Year: Not on file  . Ran Out of Food in the Last Year: Not on file  Transportation Needs:   . Lack of Transportation (Medical): Not on file  . Lack of Transportation (Non-Medical): Not on file  Physical Activity:   . Days of Exercise per Week: Not on file  . Minutes of Exercise per Session: Not on file  Stress:   . Feeling of Stress : Not on file  Social Connections:   . Frequency of Communication with  Friends and Family: Not on file  . Frequency of Social Gatherings with Friends and Family: Not on file  . Attends Religious Services: Not on file  . Active Member of Clubs or Organizations: Not on file  . Attends Banker Meetings: Not on file  . Marital Status: Not on file  Intimate Partner Violence:   . Fear of Current or Ex-Partner: Not on file  . Emotionally Abused: Not on file  . Physically Abused: Not on file  . Sexually Abused: Not on file    Environmental History: The patient lives in a 8-year-old house with carpeting in the bedroom and central air/heat.  There is no known mold/water damage in the home.  There are no pets in the home.  She is a non-smoker.  Current Outpatient Medications  Medication Sig Dispense Refill  . albuterol (PROAIR HFA) 108 (90 Base) MCG/ACT inhaler Inhale 1-2 puffs into the lungs every 6 (six) hours as needed for wheezing or shortness of breath. 18 g 1  . cetirizine (ZYRTEC) 10 MG tablet cetirizine 10 mg tablet  TAKE 1 TABLET PO DAILY    . fluticasone (FLONASE) 50 MCG/ACT nasal spray Place 1 spray into both nostrils daily.    Marland Kitchen ibuprofen (ADVIL,MOTRIN) 200 MG tablet Take 400 mg by mouth every 6 (six) hours as needed for moderate pain.    Marland Kitchen levonorgestrel (MIRENA, 52 MG,) 20 MCG/24HR IUD 1 Device by Intrauterine route See admin instructions. Take 1 device by intrauterine route    . mometasone (NASONEX) 50 MCG/ACT nasal spray Place 2 sprays into the nose daily.    Marland Kitchen omeprazole (PRILOSEC) 20 MG capsule Take 1 capsule (20 mg total) by mouth daily. 90 capsule 1  . permethrin (ELIMITE) 5 % cream APP EXT AA 1 TIME FOR 1 DOSE    . azelastine (ASTELIN) 0.1 % nasal spray Place 2 sprays into both nostrils 2 (two) times daily. 30 mL 5  . budesonide-formoterol (SYMBICORT) 160-4.5 MCG/ACT inhaler Inhale 2 puffs into the lungs 2 (two) times daily. 1 Inhaler 5   No current facility-administered medications for this visit.    Known medication  allergies: Allergies  Allergen Reactions  . Penicillins Hives    Has patient had a PCN reaction causing immediate rash, facial/tongue/throat swelling, SOB or lightheadedness with hypotension: Yes Has patient had a PCN reaction causing severe rash involving mucus membranes or skin necrosis: No Has patient had a PCN reaction that required hospitalization No Has patient had a PCN reaction occurring within the last 10 years: No If all of the above answers are "NO", then may proceed with Cephalosporin use.     I appreciate the opportunity to take part in Korrina's care. Please do not hesitate to contact me with questions.  Sincerely,   R. Jorene Guest, MD

## 2019-04-21 NOTE — Patient Instructions (Addendum)
Moderate persistent asthma Currently with suboptimal control.  A prescription has been provided for Symbicort (budesonide/formoterol) 160/4.5 g, 2 inhalations twice a day. To maximize pulmonary deposition, a spacer has been provided along with instructions for its proper administration with an HFA inhaler.  Continue albuterol HFA, 1 to 2 inhalations every 4-6 hours if needed. I have also recommended using albuterol approximately 15 minutes prior to exercise.  Subjective and objective measures of pulmonary function will be followed and the treatment plan will be adjusted accordingly.  Chronic rhinitis All seasonal and perennial aeroallergen skin tests are negative despite a positive histamine control.  Intranasal steroids, intranasal antihistamines, and first generation antihistamines are effective for symptoms associated with non-allergic rhinitis, whereas second generation antihistamines such as cetirizine (Zyrtec), loratadine (Claritin) and fexofenadine (Allegra) have been found to be ineffective for this condition.  A prescription has been provided for azelastine nasal spray, one spray per nostril 1-2 times daily as needed. Proper nasal spray technique has been discussed and demonstrated.  Nasal saline lavage (NeilMed) has been recommended as needed and prior to medicated nasal sprays along with instructions for proper administration.  Intolerance, food Gastrointestinal symptoms, uncertain etiology. Skin tests to select food allergens were negative today. The negative predictive value of food allergen skin testing is excellent (approximately 95%). While this does not appear to be an IgE mediated issue, skin testing does not rule out food intolerances or cell-mediated enteropathies which may lend to GI symptoms. These etiologies are suggested when elimination of the responsible food leads to symptom resolution and re-introduction of the food is followed by the return of symptoms.   The patient  has been encouraged to keep a careful symptom/food journal and eliminate any food suspected of correlating with symptoms. Should symptoms concerning for anaphylaxis arise, 911 is to be called immediately.  If GI symptoms persist or progress, gastroenterologist evaluation may be warranted.   Return in about 6 weeks (around 06/02/2019), or if symptoms worsen or fail to improve.

## 2019-04-21 NOTE — Assessment & Plan Note (Signed)
All seasonal and perennial aeroallergen skin tests are negative despite a positive histamine control.  Intranasal steroids, intranasal antihistamines, and first generation antihistamines are effective for symptoms associated with non-allergic rhinitis, whereas second generation antihistamines such as cetirizine (Zyrtec), loratadine (Claritin) and fexofenadine (Allegra) have been found to be ineffective for this condition.  A prescription has been provided for azelastine nasal spray, one spray per nostril 1-2 times daily as needed. Proper nasal spray technique has been discussed and demonstrated.  Nasal saline lavage (NeilMed) has been recommended as needed and prior to medicated nasal sprays along with instructions for proper administration. 

## 2019-04-21 NOTE — Telephone Encounter (Signed)
AirDuo Respiclick 232-14 mcg, 1 inhalation bid.  Thanks.

## 2019-04-21 NOTE — Assessment & Plan Note (Signed)
Gastrointestinal symptoms, uncertain etiology. Skin tests to select food allergens were negative today. The negative predictive value of food allergen skin testing is excellent (approximately 95%). While this does not appear to be an IgE mediated issue, skin testing does not rule out food intolerances or cell-mediated enteropathies which may lend to GI symptoms. These etiologies are suggested when elimination of the responsible food leads to symptom resolution and re-introduction of the food is followed by the return of symptoms.   The patient has been encouraged to keep a careful symptom/food journal and eliminate any food suspected of correlating with symptoms. Should symptoms concerning for anaphylaxis arise, 911 is to be called immediately.  If GI symptoms persist or progress, gastroenterologist evaluation may be warranted. 

## 2019-04-22 MED ORDER — BREO ELLIPTA 200-25 MCG/INH IN AEPB: 1.0000 | INHALATION_SPRAY | Freq: Every day | RESPIRATORY_TRACT | 5 refills | Status: AC

## 2019-04-22 NOTE — Addendum Note (Signed)
Addended by: Teressa Senter on: 04/22/2019 09:47 AM   Modules accepted: Orders

## 2019-04-22 NOTE — Telephone Encounter (Signed)
Unfortunately AirDuo Respiclick is not one of the alternatives please consider Symbicort Aer, Advair Diskus, Earlie Server, Wixela, Fluticsalmeaer

## 2019-04-22 NOTE — Telephone Encounter (Signed)
I've never heard of Symbicort Aer before, I had assumed they meant AirDuo. Let's go with Breo Ellipta 200-25, 1 inhalation daily.  Thanks.

## 2019-04-22 NOTE — Telephone Encounter (Signed)
Call to patient, informed her of change of medication.  Rx for Virgel Bouquet has been sent to the pharmacy.

## 2019-04-23 NOTE — Telephone Encounter (Signed)
Left message to notify patient new Rx was sent to pharmacy.

## 2019-06-09 ENCOUNTER — Ambulatory Visit: Payer: BC Managed Care – PPO | Admitting: Allergy and Immunology

## 2021-01-06 IMAGING — US US PELVIS COMPLETE TRANSABD/TRANSVAG W DUPLEX
2 series · 13 of 25 positions shown · non-contrast
Comparison: CT dated 12/30/2015

CLINICAL DATA: Pain



[Series 1: us pelvis complete transabd/transvag w duplex · 12 of 145 slices shown (1 of 2)]
[im 1/145]
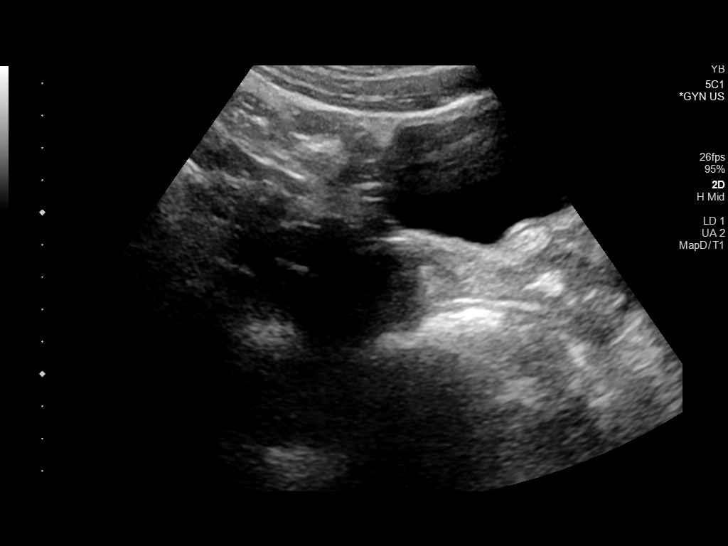
[im 13/145]
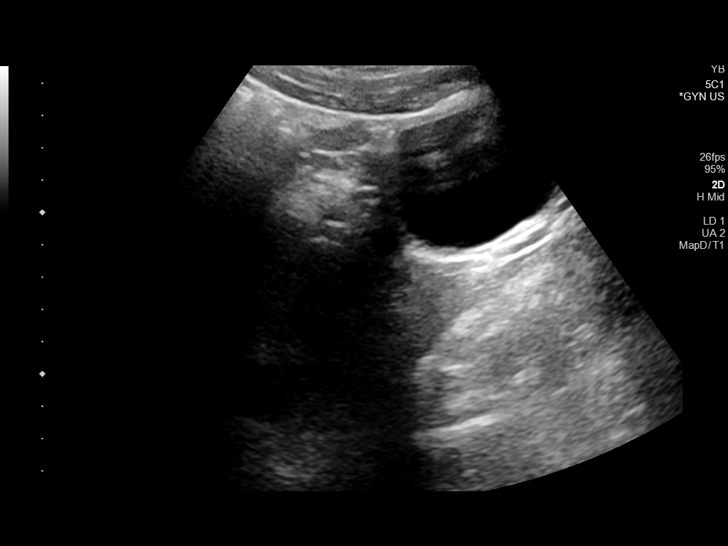
[im 26/145]
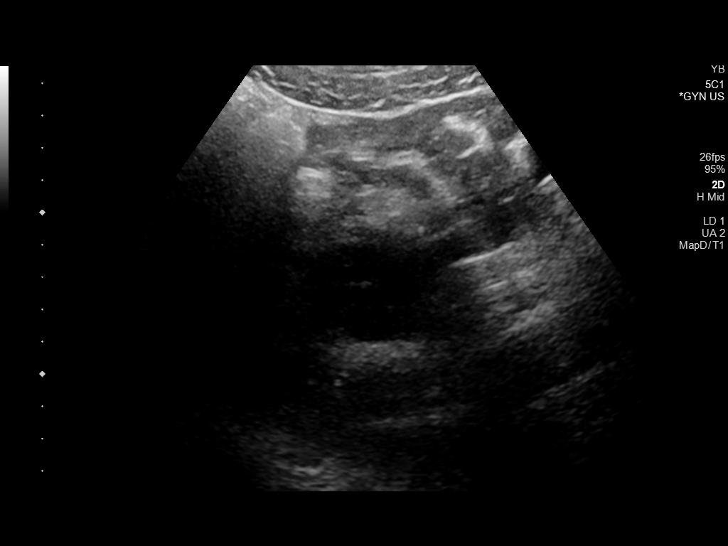
[im 38/145]
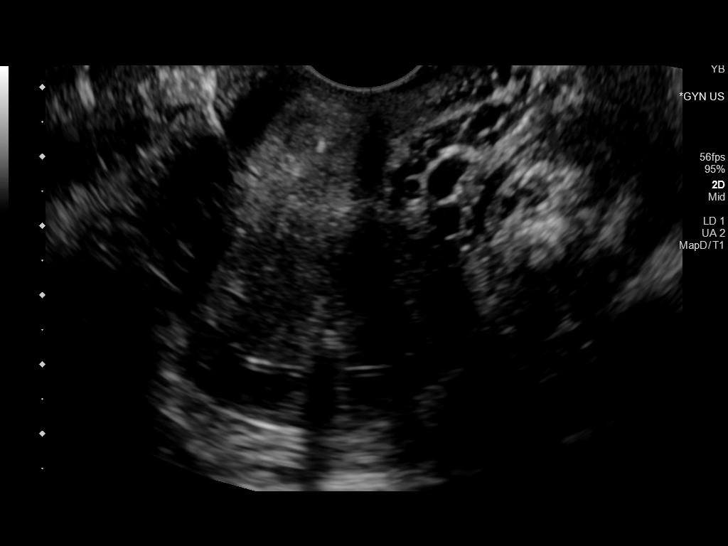
[im 51/145]
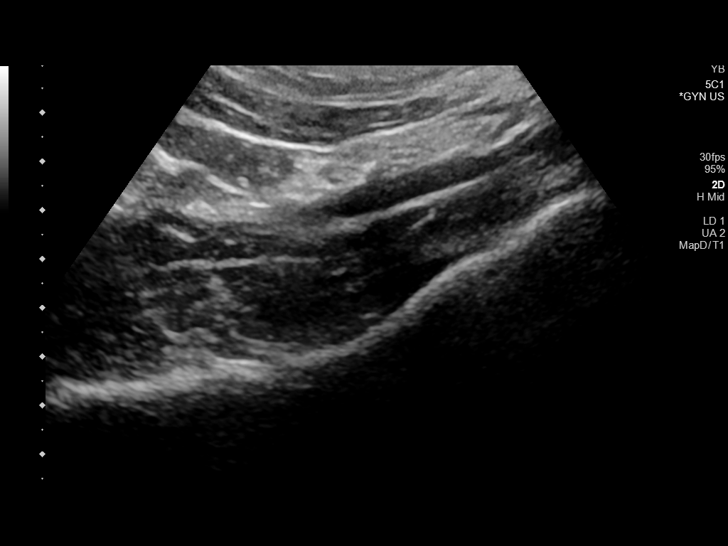
[im 63/145]
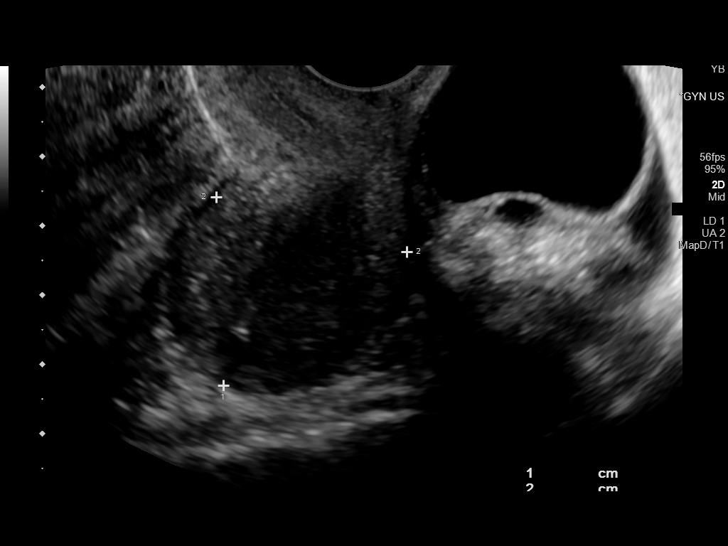
[im 76/145]
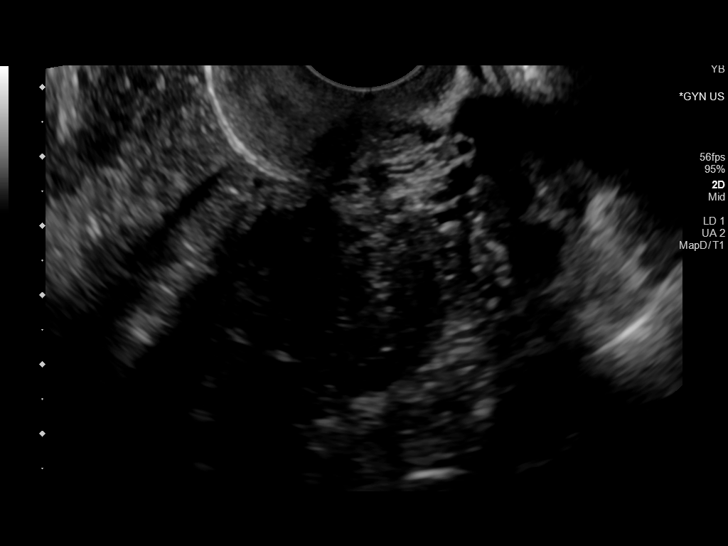
[im 88/145]
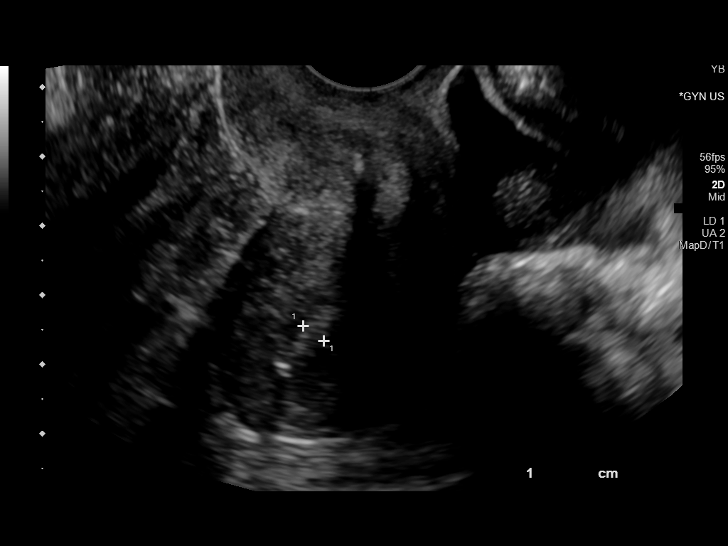
[im 101/145]
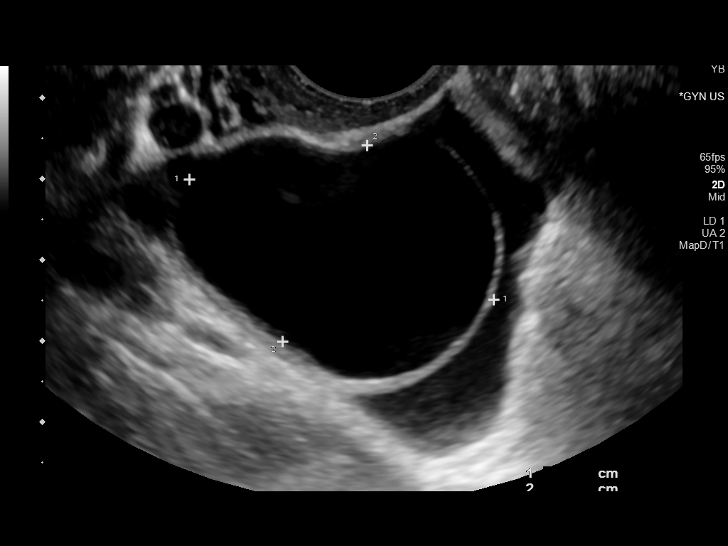
[im 113/145]
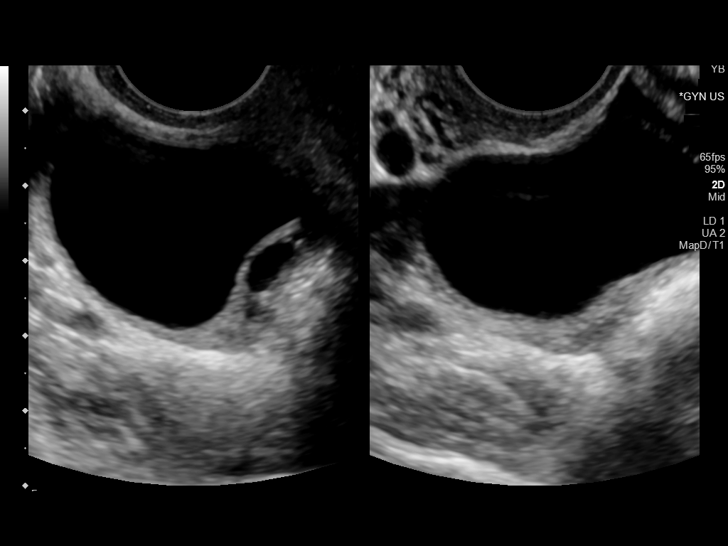
[im 126/145]
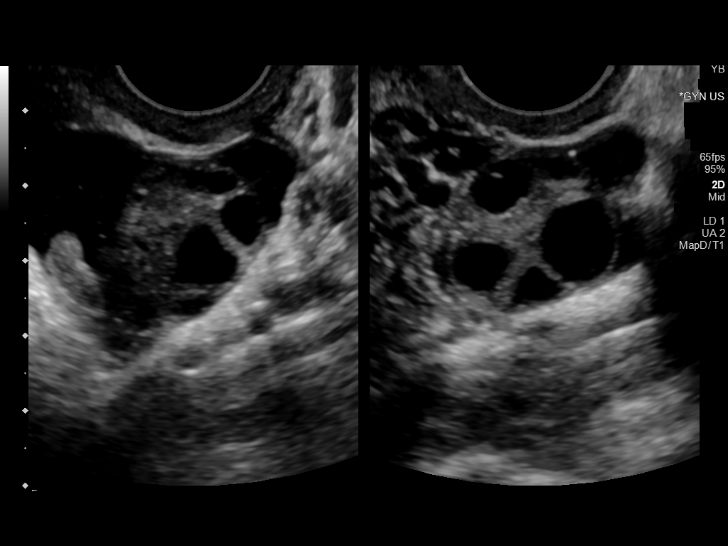
[im 138/145]
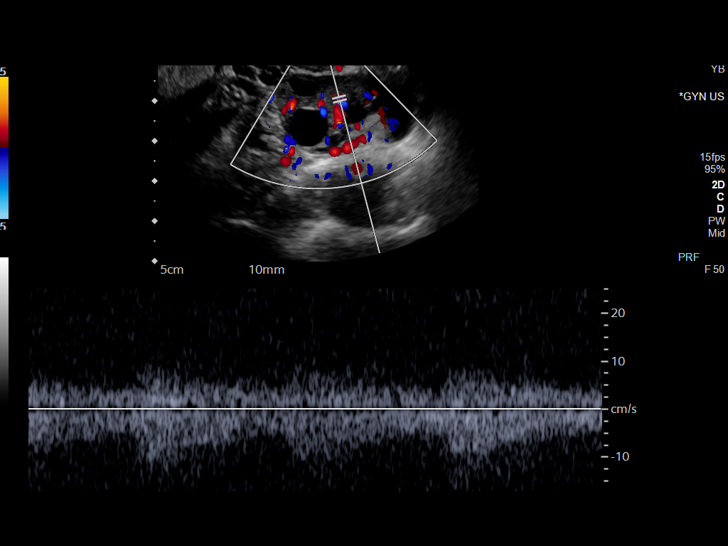

[Series 3: us pelvis complete transabd/transvag w duplex · 1 of 6 slices shown (2 of 2)]
[im 1/6]
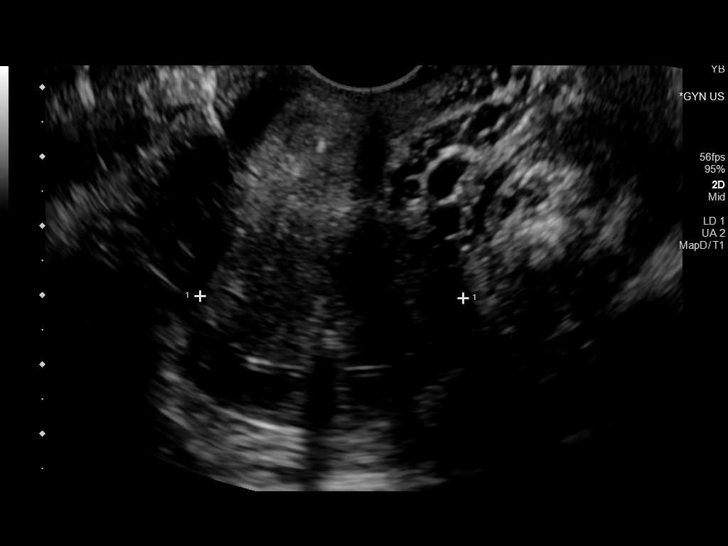

[13 of 25 positions shown; findings below may reference images not displayed]

FINDINGS: Uterus

Measurements: 5.2 x 2.9 x 3.8 cm. No fibroids or other mass
visualized.

Endometrium

Thickness: 4 mm.  There is a well-positioned IUD.

Right ovary

Measurements: 4.5 x 2.8 x 3.6 cm = volume: 23.8 mL. There is a
simple appearing 4 x 2.6 x 3.1 cm cyst.

Left ovary

Measurements: 2.4 x 2.2 x 2.1 cm = volume: 5.6 mL. Normal
appearance/no adnexal mass.

Other findings

There is a moderate amount of free fluid in the patient's pelvis
IMPRESSION: 1. 4 cm simple appearing right-sided cyst.
2. Moderate amount of free fluid in the patient's pelvis.
3. Well-positioned IUD.

## 2024-02-01 ENCOUNTER — Emergency Department (HOSPITAL_COMMUNITY)

## 2024-02-01 ENCOUNTER — Emergency Department (HOSPITAL_COMMUNITY)
Admission: EM | Admit: 2024-02-01 | Discharge: 2024-02-01 | Disposition: A | Discharge status: Home / Self Care | Attending: Emergency Medicine | Admitting: Emergency Medicine

## 2024-02-01 ENCOUNTER — Other Ambulatory Visit: Payer: Self-pay

## 2024-02-01 DIAGNOSIS — R112 Nausea with vomiting, unspecified: Secondary | ICD-10-CM | POA: Diagnosis present

## 2024-02-01 DIAGNOSIS — R197 Diarrhea, unspecified: Secondary | ICD-10-CM | POA: Diagnosis not present

## 2024-02-01 DIAGNOSIS — Z7951 Long term (current) use of inhaled steroids: Secondary | ICD-10-CM | POA: Diagnosis not present

## 2024-02-01 DIAGNOSIS — R1030 Lower abdominal pain, unspecified: Secondary | ICD-10-CM | POA: Insufficient documentation

## 2024-02-01 DIAGNOSIS — J45909 Unspecified asthma, uncomplicated: Secondary | ICD-10-CM | POA: Insufficient documentation

## 2024-02-01 DIAGNOSIS — N390 Urinary tract infection, site not specified: Secondary | ICD-10-CM

## 2024-02-01 LAB — LIPASE, BLOOD: Lipase: 15 U/L (ref 11–51)

## 2024-02-01 LAB — URINALYSIS, ROUTINE W REFLEX MICROSCOPIC
Bilirubin Urine: NEGATIVE
Glucose, UA: NEGATIVE mg/dL
Ketones, ur: 80 mg/dL — AB
Leukocytes,Ua: NEGATIVE
Nitrite: NEGATIVE
Protein, ur: 100 mg/dL — AB
Specific Gravity, Urine: 1.038 — ABNORMAL HIGH (ref 1.005–1.030)
pH: 5 (ref 5.0–8.0)

## 2024-02-01 LAB — CBC WITH DIFFERENTIAL/PLATELET
Abs Immature Granulocytes: 0.02 K/uL (ref 0.00–0.07)
Basophils Absolute: 0 K/uL (ref 0.0–0.1)
Basophils Relative: 1 %
Eosinophils Absolute: 0.1 K/uL (ref 0.0–0.5)
Eosinophils Relative: 1 %
HCT: 45.4 % (ref 36.0–46.0)
Hemoglobin: 15 g/dL (ref 12.0–15.0)
Immature Granulocytes: 0 %
Lymphocytes Relative: 28 %
Lymphs Abs: 2.1 K/uL (ref 0.7–4.0)
MCH: 27.8 pg (ref 26.0–34.0)
MCHC: 33 g/dL (ref 30.0–36.0)
MCV: 84.1 fL (ref 80.0–100.0)
Monocytes Absolute: 0.8 K/uL (ref 0.1–1.0)
Monocytes Relative: 10 %
Neutro Abs: 4.4 K/uL (ref 1.7–7.7)
Neutrophils Relative %: 60 %
Platelets: 371 K/uL (ref 150–400)
RBC: 5.4 MIL/uL — ABNORMAL HIGH (ref 3.87–5.11)
RDW: 12.4 % (ref 11.5–15.5)
WBC: 7.4 K/uL (ref 4.0–10.5)
nRBC: 0 % (ref 0.0–0.2)

## 2024-02-01 LAB — COMPREHENSIVE METABOLIC PANEL WITH GFR
ALT: 14 U/L (ref 0–44)
AST: 23 U/L (ref 15–41)
Albumin: 5 g/dL (ref 3.5–5.0)
Alkaline Phosphatase: 73 U/L (ref 38–126)
Anion gap: 18 — ABNORMAL HIGH (ref 5–15)
BUN: 15 mg/dL (ref 6–20)
CO2: 18 mmol/L — ABNORMAL LOW (ref 22–32)
Calcium: 10.2 mg/dL (ref 8.9–10.3)
Chloride: 101 mmol/L (ref 98–111)
Creatinine, Ser: 0.89 mg/dL (ref 0.44–1.00)
GFR, Estimated: >60 mL/min (ref >60)
Glucose, Bld: 80 mg/dL (ref 70–99)
Potassium: 3.6 mmol/L (ref 3.5–5.1)
Sodium: 137 mmol/L (ref 135–145)
Total Bilirubin: 0.5 mg/dL (ref 0.0–1.2)
Total Protein: 8.1 g/dL (ref 6.5–8.1)

## 2024-02-01 LAB — PREGNANCY, URINE: Preg Test, Ur: NEGATIVE

## 2024-02-01 MED ORDER — IOHEXOL 300 MG/ML  SOLN
100.0000 mL | Freq: Once | INTRAMUSCULAR | Status: AC | PRN
  Administered 2024-02-01: 100 mL via INTRAVENOUS

## 2024-02-01 MED ORDER — ONDANSETRON HCL 4 MG PO TABS: 4.0000 mg | ORAL_TABLET | Freq: Three times a day (TID) | ORAL | 0 refills | Status: AC | PRN

## 2024-02-01 MED ORDER — SODIUM CHLORIDE 0.9 % IV BOLUS
1000.0000 mL | Freq: Once | INTRAVENOUS | Status: AC
  Administered 2024-02-01: 1000 mL via INTRAVENOUS

## 2024-02-01 MED ORDER — CEPHALEXIN 500 MG PO CAPS: 500.0000 mg | ORAL_CAPSULE | Freq: Four times a day (QID) | ORAL | 0 refills | Status: AC

## 2024-02-01 MED ORDER — ONDANSETRON HCL 4 MG/2ML IJ SOLN
4.0000 mg | Freq: Once | INTRAMUSCULAR | Status: AC
Start: 2024-02-01 — End: 2024-02-01
  Administered 2024-02-01: 4 mg via INTRAVENOUS
  Filled 2024-02-01: qty 2

## 2024-02-01 MED ORDER — MORPHINE SULFATE (PF) 4 MG/ML IV SOLN
4.0000 mg | Freq: Once | INTRAVENOUS | Status: AC
  Administered 2024-02-01: 4 mg via INTRAVENOUS
  Filled 2024-02-01: qty 1

## 2024-02-01 MED ORDER — ONDANSETRON HCL 4 MG/2ML IJ SOLN
4.0000 mg | Freq: Once | INTRAMUSCULAR | Status: AC
  Administered 2024-02-01: 4 mg via INTRAVENOUS
  Filled 2024-02-01: qty 2

## 2024-02-01 NOTE — ED Triage Notes (Signed)
 Pt ambulatory to triage with complaints of vomiting since 10/27. Pt states that she was seen by her PCP and given RX for Omprazole and ondansetron . PT states my body doesn't agree with medications. Pt has not been taking meds as ordered. PT was educated that omeprazole  takes time to work, and should be taken regularly as ordered.

## 2024-02-01 NOTE — ED Notes (Signed)
 Urine specimen sent to lab

## 2024-02-01 NOTE — ED Provider Notes (Signed)
 [place] EMERGENCY DEPARTMENT AT [facility] Provider Note   CSN: [id number] Arrival date & time: 02/01/24  1020     Patient presents with: Emesis   Doris Garcia is a 23 y.o. female with PMHx asthma who presents to ED concerned for nausea, vomiting, and diarrhea progressing over the past 5 days. Patient stating that these symptoms are more chronic for here and will flare intermittently throughout the year. Patient used to see a GI provider in the past, but has been recently referred to a new one since she recently moved to [place]. Patient stating that Zofran  ODT has been the only medication to provide her with any relief. Patient concerned today because she started vomiting dark red bile.   Endorses subjective fevers. denies cough, hematochezia, dysuria, hematuria, vaginal discharge. Endorses chest pain when she vomits a lot - but does not have chest pain currently.   No recent ABX use or hx of Crohn's/IBS.  No recent travel.    Emesis      Prior to Admission medications   Medication Sig Start Date End Date Taking? Authorizing Provider  albuterol  (PROAIR  HFA) 108 (90 Base) MCG/ACT inhaler Inhale 1-2 puffs into the lungs every 6 (six) hours as needed for wheezing or shortness of breath. 04/21/19   Bobbitt, Elgin Pepper, MD  azelastine  (ASTELIN ) 0.1 % nasal spray Place 2 sprays into both nostrils 2 (two) times daily. 04/21/19   Bobbitt, Elgin Pepper, MD  cetirizine (ZYRTEC) 10 MG tablet cetirizine 10 mg tablet  TAKE 1 TABLET PO DAILY    [provider]  fluticasone (FLONASE) 50 MCG/ACT nasal spray Place 1 spray into both nostrils daily. 01/07/19   [provider]  fluticasone furoate-vilanterol (BREO ELLIPTA ) 200-25 MCG/INH AEPB Inhale 1 puff into the lungs daily. 04/22/19   Bobbitt, Elgin Pepper, MD  ibuprofen  (ADVIL ,MOTRIN ) 200 MG tablet Take 400 mg by mouth every 6 (six) hours as needed for moderate pain.    [provider]  levonorgestrel  (MIRENA, 52 MG,) 20 MCG/24HR IUD 1 Device by Intrauterine route See admin instructions. Take 1 device by intrauterine route    [provider]  mometasone (NASONEX) 50 MCG/ACT nasal spray Place 2 sprays into the nose daily.    [provider]  omeprazole  (PRILOSEC) 20 MG capsule Take 1 capsule (20 mg total) by mouth daily. 03/12/19   Armbruster, Elspeth SQUIBB, MD  permethrin (ELIMITE) 5 % cream APP EXT AA 1 TIME FOR 1 DOSE 02/28/19   [provider]    Allergies: Penicillins    Review of Systems  Gastrointestinal:  Positive for vomiting.    Updated Vital Signs BP (!) 144/100 (BP Location: Right Arm)   Pulse (!) 116   Temp 98.5 F (36.9 C) (Oral)   Resp 18   SpO2 100%   Physical Exam Vitals and nursing note reviewed.  Constitutional:      General: She is not in acute distress.    Appearance: She is not ill-appearing or toxic-appearing.  HENT:     Head: Normocephalic and atraumatic.     Mouth/Throat:     Mouth: Mucous membranes are moist.  Eyes:     General: No scleral icterus.       Right eye: No discharge.        Left eye: No discharge.     Conjunctiva/sclera: Conjunctivae normal.  Cardiovascular:     Rate and Rhythm: Normal rate and regular rhythm.     Pulses: Normal pulses.  Heart sounds: Normal heart sounds. No murmur heard. Pulmonary:     Effort: Pulmonary effort is normal. No respiratory distress.     Breath sounds: Normal breath sounds. No wheezing, rhonchi or rales.  Abdominal:     General: Abdomen is flat. Bowel sounds are normal. There is no distension.     Palpations: Abdomen is soft. There is no mass.     Tenderness: There is abdominal tenderness.     Comments: Lower abdominal tenderness to palpation  Musculoskeletal:     Right lower leg: No edema.     Left lower leg: No edema.  Skin:    General: Skin is warm and dry.     Findings: No rash.  Neurological:     General: No focal deficit present.     Mental Status: She is alert and  oriented to person, place, and time. Mental status is at baseline.  Psychiatric:        Mood and Affect: Mood normal.        Behavior: Behavior normal.     (all labs ordered are listed, but only abnormal results are displayed) Labs Reviewed - No data to display  EKG: None  Radiology: No results found.   Procedures   Medications Ordered in the ED - No data to display                                  Medical Decision Making Amount and/or Complexity of Data Reviewed Labs: ordered. Radiology: ordered.  Risk Prescription drug management.    This patient presents to the ED for concern of abdominal pain, this involves an extensive number of treatment options, and is a complaint that carries with it a high risk of complications and morbidity.  The differential diagnosis includes gastroenteritis, colitis, small bowel obstruction, appendicitis, cholecystitis, pancreatitis, nephrolithiasis, UTI, pyelonephritis   Co morbidities that complicate the patient evaluation  asthma   Additional history obtained:  Dr. Jacques PCP   Problem List / ED Course / Critical interventions / Medication management  Patient presented for abdominal pain, nausea, vomiting, hematemesis progressing over the past 5 days. Physical exam with lower abdominal tenderness to palpation. Patient also initially tachycardic at 116 BPM which resolved with IV fluids. Rest of physical exam reassuring.  I Ordered, and personally interpreted labs.  UA with many bacteria, but does have 6 oh show squamous epithelial cells (patient denies dysuria so I doubt she has UTI today).  CBC without leukocytosis or anemia.  CMP with a mild anion gap at 18.  Lipase within normal limits.  UPT negative. I ordered imaging studies including CT Abd/Pelvis with contrast: evaluate for structural/surgical etiology of patients' severe abdominal pain.  This imaging result is pending. I have reviewed the patients home medicines and have  made adjustments as needed    Social Determinants of Health:  none  3PM Care of Doris Garcia transferred to PA Doris Garcia at the end of my shift as the patient will require reassessment once labs/imaging have resulted. Patient presentation, ED course, and plan of care discussed with review of all pertinent labs and imaging. Please see his/her note for further details regarding further ED course and disposition. Plan at time of handoff is reassess patient after workup results. This may be altered or completely changed at the discretion of the oncoming team pending results of further workup.      Final diagnoses:  None  ED Discharge Orders     None          Hoy Nidia FALCON, [place] 02/01/24 1510    Charlyn Sora, MD 02/02/24 [phone]

## 2024-02-01 NOTE — ED Provider Notes (Signed)
 Received signout from previous provider, please see her note for complete H&P.  Patient endorsed lower abdominal discomfort with associate nausea vomiting diarrhea ongoing for the past 5 days.  She did take some over-the-counter antinausea medication which seems to help a little bit but she noticed that her vomitus was dark red bowel which concerns her.  She does not complain of any urinary discomfort no vaginal bleeding no vaginal discharge and no new sexual partner.  Labs obtained today independent viewed interpret by me and overall reassuring.  Urine shows possible signs of UTI however patient denies have any urinary symptoms.  She also voiced no concern for STI.  I will preemptively prescribe antibiotics for patient to have on hand in the event that she developed urinary discomfort.  CT scan of the abdomen pelvis independently viewed inter by me and overall reassuring no acute changes.  Agree with radiologist interpretation.  BP 134/74   Pulse 73   Temp 98.8 F (37.1 C) (Oral)   Resp 16   SpO2 100%   Results for orders placed or performed during the hospital encounter of 02/01/24  Urinalysis, Routine w reflex microscopic -Urine, Clean Catch   Collection Time: 02/01/24 12:37 PM  Result Value Ref Range   Color, Urine YELLOW YELLOW   APPearance TURBID (A) CLEAR   Specific Gravity, Urine 1.038 (H) 1.005 - 1.030   pH 5.0 5.0 - 8.0   Glucose, UA NEGATIVE NEGATIVE mg/dL   Hgb urine dipstick SMALL (A) NEGATIVE   Bilirubin Urine NEGATIVE NEGATIVE   Ketones, ur 80 (A) NEGATIVE mg/dL   Protein, ur 899 (A) NEGATIVE mg/dL   Nitrite NEGATIVE NEGATIVE   Leukocytes,Ua NEGATIVE NEGATIVE   RBC / HPF 0-5 0 - 5 RBC/hpf   WBC, UA 0-5 0 - 5 WBC/hpf   Bacteria, UA MANY (A) NONE SEEN   Squamous Epithelial / HPF 6-10 0 - 5 /HPF   Mucus PRESENT   Pregnancy, urine   Collection Time: 02/01/24 12:38 PM  Result Value Ref Range   Preg Test, Ur NEGATIVE NEGATIVE  CBC with Differential   Collection Time:  02/01/24  1:25 PM  Result Value Ref Range   WBC 7.4 4.0 - 10.5 K/uL   RBC 5.40 (H) 3.87 - 5.11 MIL/uL   Hemoglobin 15.0 12.0 - 15.0 g/dL   HCT 54.5 63.9 - 53.9 %   MCV 84.1 80.0 - 100.0 fL   MCH 27.8 26.0 - 34.0 pg   MCHC 33.0 30.0 - 36.0 g/dL   RDW 87.5 88.4 - 84.4 %   Platelets 371 150 - 400 K/uL   nRBC 0.0 0.0 - 0.2 %   Neutrophils Relative % 60 %   Neutro Abs 4.4 1.7 - 7.7 K/uL   Lymphocytes Relative 28 %   Lymphs Abs 2.1 0.7 - 4.0 K/uL   Monocytes Relative 10 %   Monocytes Absolute 0.8 0.1 - 1.0 K/uL   Eosinophils Relative 1 %   Eosinophils Absolute 0.1 0.0 - 0.5 K/uL   Basophils Relative 1 %   Basophils Absolute 0.0 0.0 - 0.1 K/uL   Immature Granulocytes 0 %   Abs Immature Granulocytes 0.02 0.00 - 0.07 K/uL  Comprehensive metabolic panel   Collection Time: 02/01/24  1:25 PM  Result Value Ref Range   Sodium 137 135 - 145 mmol/L   Potassium 3.6 3.5 - 5.1 mmol/L   Chloride 101 98 - 111 mmol/L   CO2 18 (L) 22 - 32 mmol/L   Glucose, Bld 80 70 -  99 mg/dL   BUN 15 6 - 20 mg/dL   Creatinine, Ser 9.10 0.44 - 1.00 mg/dL   Calcium 89.7 8.9 - 89.6 mg/dL   Total Protein 8.1 6.5 - 8.1 g/dL   Albumin 5.0 3.5 - 5.0 g/dL   AST 23 15 - 41 U/L   ALT 14 0 - 44 U/L   Alkaline Phosphatase 73 38 - 126 U/L   Total Bilirubin 0.5 0.0 - 1.2 mg/dL   GFR, Estimated >39 >39 mL/min   Anion gap 18 (H) 5 - 15  Lipase, blood   Collection Time: 02/01/24  1:25 PM  Result Value Ref Range   Lipase 15 11 - 51 U/L   CT ABDOMEN PELVIS W CONTRAST Result Date: 02/01/2024 CLINICAL DATA:  Acute abdominal pain, vomiting for 4 days EXAM: CT ABDOMEN AND PELVIS WITH CONTRAST TECHNIQUE: Multidetector CT imaging of the abdomen and pelvis was performed using the standard protocol following bolus administration of intravenous contrast. RADIATION DOSE REDUCTION: This exam was performed according to the departmental dose-optimization program which includes automated exposure control, adjustment of the mA and/or kV  according to patient size and/or use of iterative reconstruction technique. CONTRAST:  OMNIPAQUE IOHEXOL 300 MG/ML  SOLN COMPARISON:  12/29/2015 FINDINGS: Lower chest: No acute pleural or parenchymal lung disease. Hepatobiliary: No focal liver abnormality is seen. No gallstones, gallbladder wall thickening, or biliary dilatation. Pancreas: Unremarkable. No pancreatic ductal dilatation or surrounding inflammatory changes. Spleen: Normal in size without focal abnormality. Adrenals/Urinary Tract: Adrenal glands are unremarkable. Kidneys are normal, without renal calculi, focal lesion, or hydronephrosis. Bladder is decompressed, limiting its evaluation. Stomach/Bowel: No bowel obstruction or ileus. Normal appendix right lower quadrant. No bowel wall thickening or inflammatory change. Vascular/Lymphatic: No significant vascular findings are present. No enlarged abdominal or pelvic lymph nodes. Reproductive: Uterus and bilateral adnexa are unremarkable. Other: No free fluid or free intraperitoneal gas. No abdominal wall hernia. Musculoskeletal: No acute or destructive bony abnormalities. Reconstructed images demonstrate no additional findings. IMPRESSION: 1. No acute intra-abdominal or intrapelvic process. Electronically Signed   By: Ozell Daring M.D.   On: 02/01/2024 16:10      Nivia Colon, PA-C 02/01/24 1701    Randol Simmonds, MD 02/02/24 1536

## 2024-02-01 NOTE — Discharge Instructions (Addendum)
 Fortunately no concerning findings were noted on today's exam.  Your CT scan is normal.  Your urine shows possible sign of urinary tract infection.  If you develop burning with urination urinary frequency or urgency please take antibiotic as prescribed.  You may take Zofran  as needed for nausea.
